# Patient Record
Sex: Female | Born: 1940 | Race: White | Hispanic: No | Marital: Married
Health system: Western US, Academic
[De-identification: ages and names within clinical notes are randomized; demographics above are authoritative.]

---

## 2019-12-08 ENCOUNTER — Ambulatory Visit: Payer: Medicare Other | Attending: Critical Care

## 2019-12-08 DIAGNOSIS — Z23 Encounter for immunization: Secondary | ICD-10-CM | POA: Insufficient documentation

## 2019-12-28 ENCOUNTER — Encounter: Payer: Self-pay | Admitting: Critical Care

## 2019-12-30 ENCOUNTER — Ambulatory Visit: Payer: Medicare Other | Attending: Critical Care

## 2019-12-30 DIAGNOSIS — Z23 Encounter for immunization: Secondary | ICD-10-CM

## 2020-08-26 ENCOUNTER — Ambulatory Visit: Payer: Medicare Other | Attending: Family Practice

## 2020-08-26 DIAGNOSIS — Z23 Encounter for immunization: Secondary | ICD-10-CM | POA: Insufficient documentation

## 2021-02-27 ENCOUNTER — Ambulatory Visit: Payer: Medicare Other | Attending: Nurse Practitioner

## 2021-02-27 DIAGNOSIS — Z23 Encounter for immunization: Secondary | ICD-10-CM | POA: Insufficient documentation

## 2021-02-27 NOTE — Interdisciplinary (Signed)
02/27/21 2:50 PT. HERE FOR PFIZER COVID BOOSTER VACCINE. GIVEN. Margaret Manning

## 2021-08-08 ENCOUNTER — Ambulatory Visit: Payer: Medicare Other | Attending: Nurse Practitioner

## 2021-08-08 DIAGNOSIS — Z23 Encounter for immunization: Secondary | ICD-10-CM | POA: Insufficient documentation

## 2021-08-08 NOTE — Interdisciplinary (Signed)
PT. HERE FOR COVID VACCINE PFIZER BIVALENT . GIVEN . L. Stoy Fenn

## 2021-10-21 IMAGING — MR MRA NECK WO/W CONTRAST
4 of 8 series · 26 of 48 positions shown · IV contrast (20cc ProHance)
Comparison: None.

INDICATION: 79-year-old female with vertigo and dizziness.
TECHNIQUE: MRA of the neck without and with intravenous contrast. Two-dimension time-of-flight MR angiography of the carotid bifurcations is performed without IV contrast.  This is followed by a 3D gadolinium bolus enhanced MR angiogram of the carotid arteries using 20 cc IV ProHance. 2D reconstruction including coronal and sagittal images and 3D reconstruction using MIP and volume rendering algorithms are performed under concurrent physician supervision.

[Series 3: tof_fl2d_axials · axial · 3.0mm · 0.39mm/px · z∈[-87,+100]mm · 7 of 94 slices shown]
[im 1/94]
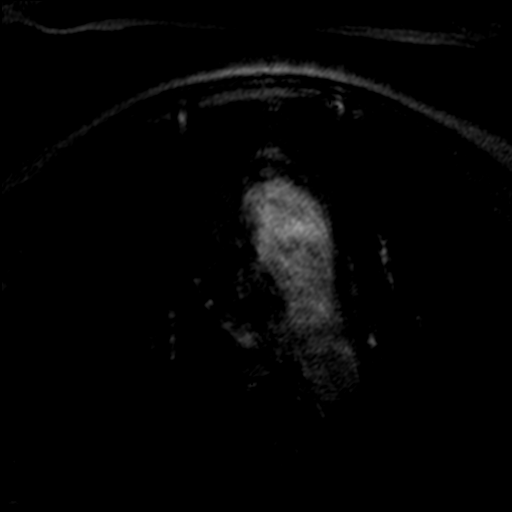
[im 16/94]
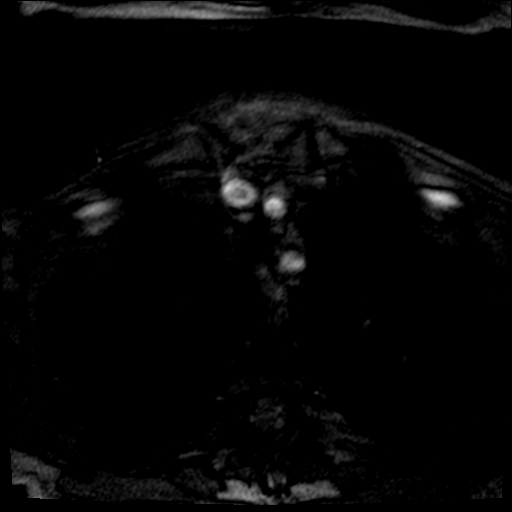
[im 32/94]
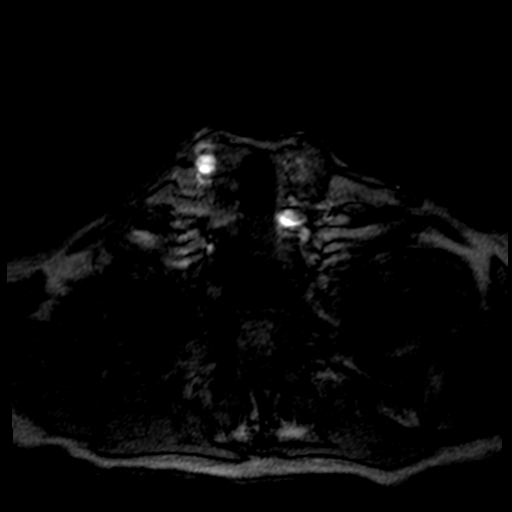
[im 47/94]
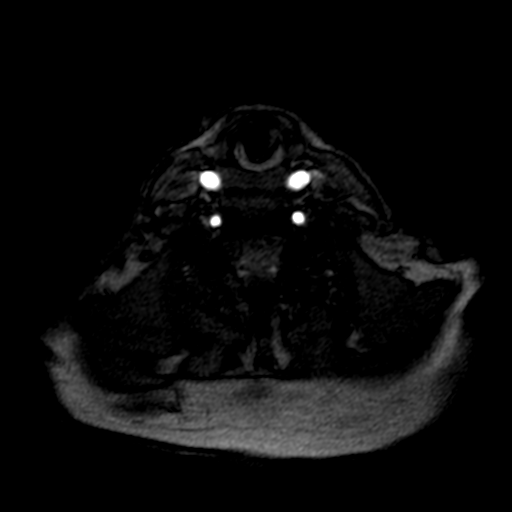
[im 63/94]
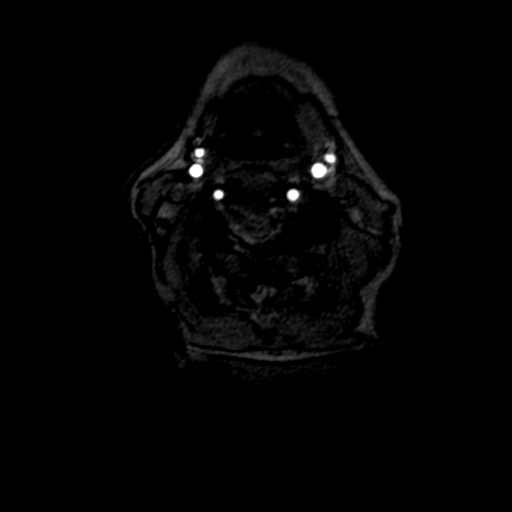
[im 78/94]
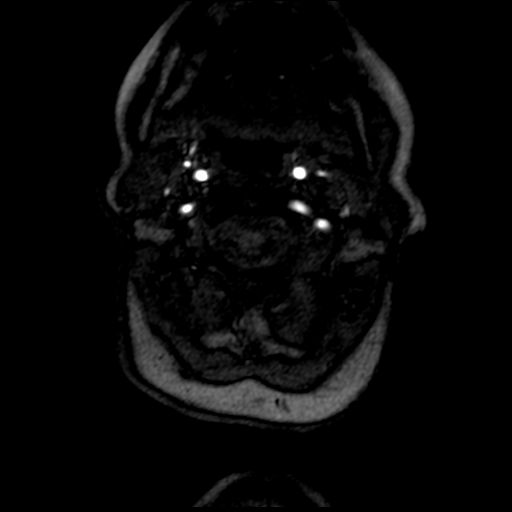
[im 94/94]
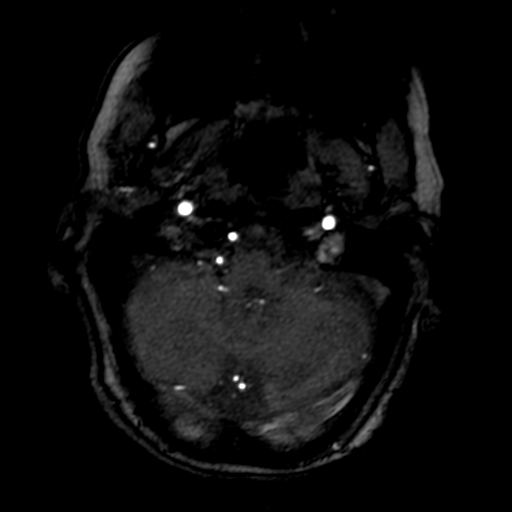

[Series 7: t1_axial_fs · axial · 5.0mm · 0.35mm/px · z∈[-93,+105]mm · 3 of 34 slices shown]
[im 1/34]
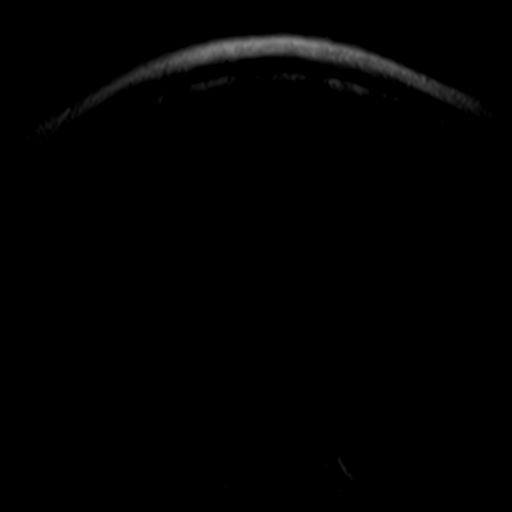
[im 17/34]
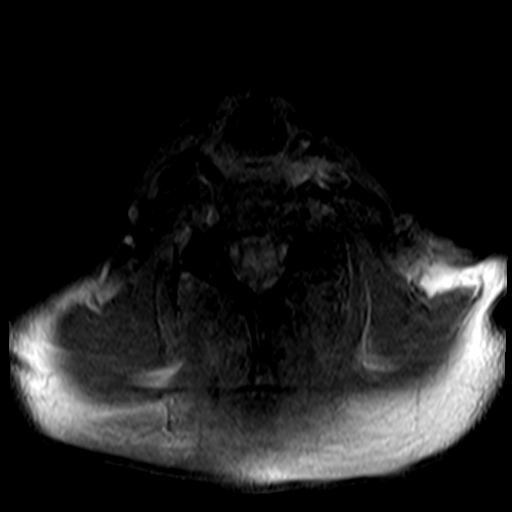
[im 34/34]
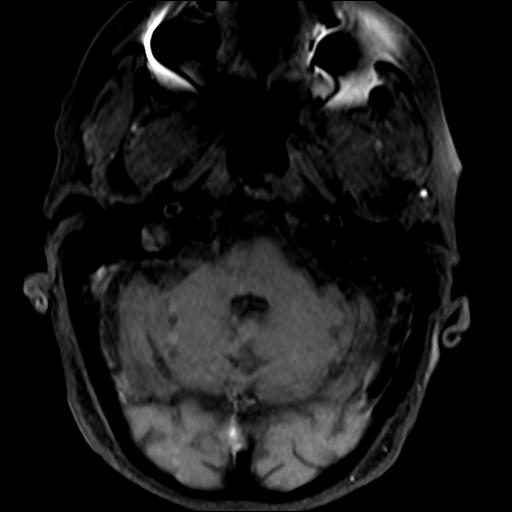

[Series 11: fl3d_ce_cor_+c_tt=1.0s · coronal · 1.0mm · 0.82mm/px · 8 of 96 slices shown]
[im 1/96]
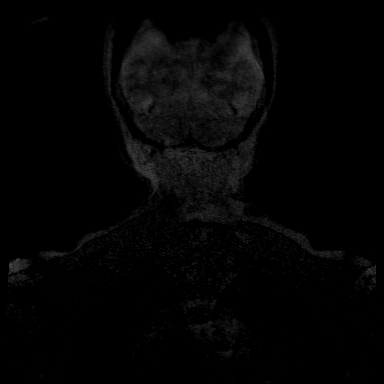
[im 14/96]
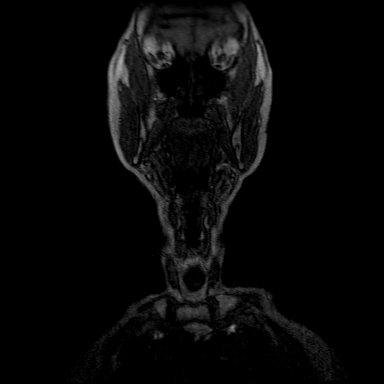
[im 28/96]
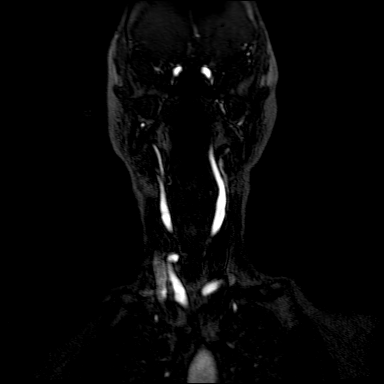
[im 41/96]
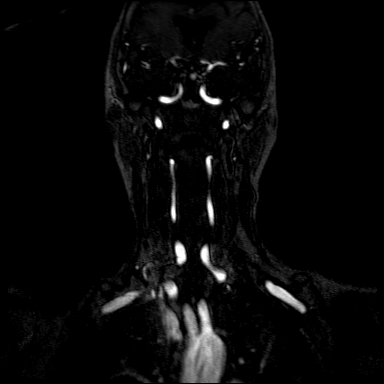
[im 55/96]
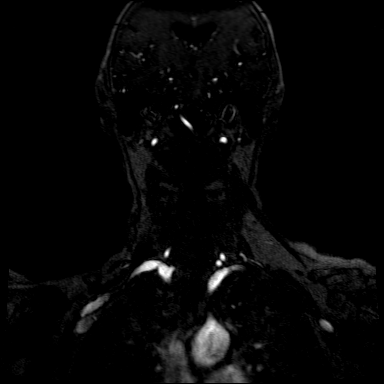
[im 68/96]
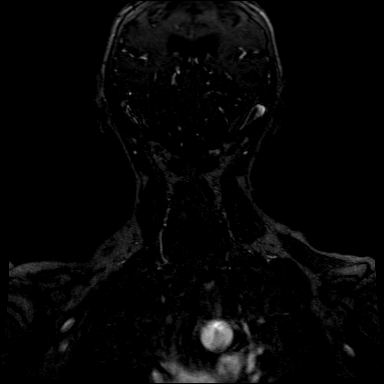
[im 82/96]
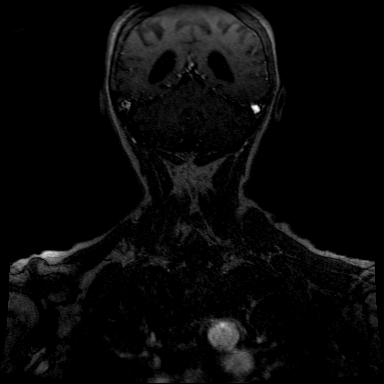
[im 96/96]
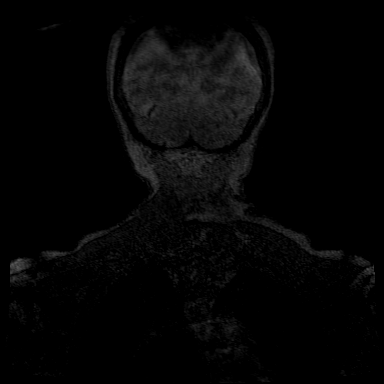

[Series 12: fl3d_ce_cor_+c_tt=1.0s_sub · coronal · 1.0mm · 0.82mm/px · 8 of 96 slices shown]
[im 1/96]
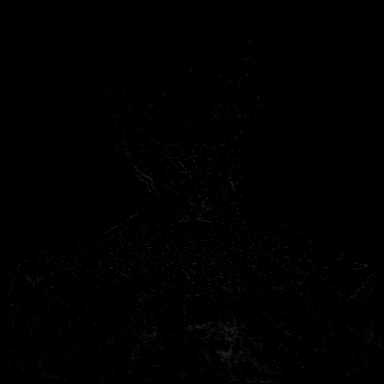
[im 14/96]
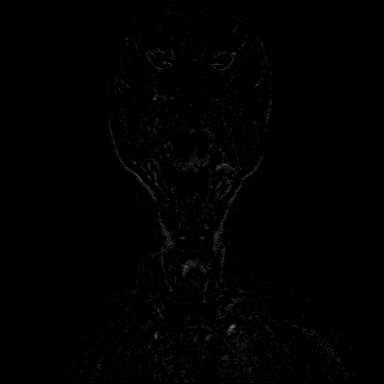
[im 28/96]
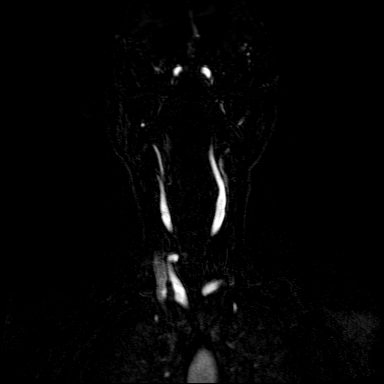
[im 41/96]
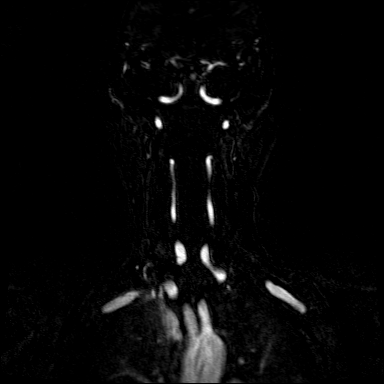
[im 55/96]
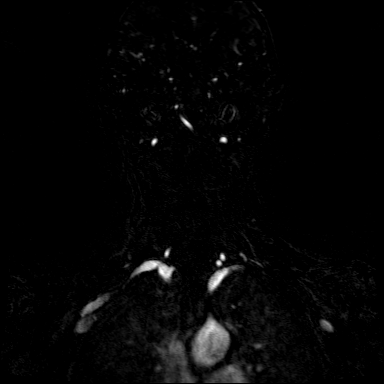
[im 68/96]
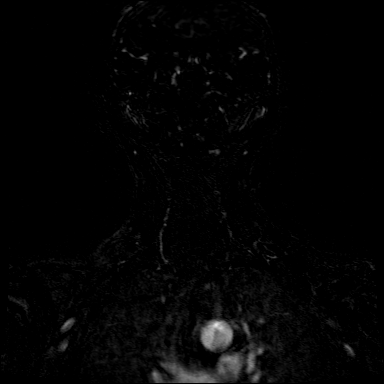
[im 82/96]
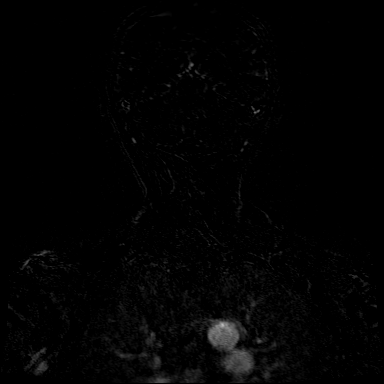
[im 96/96]
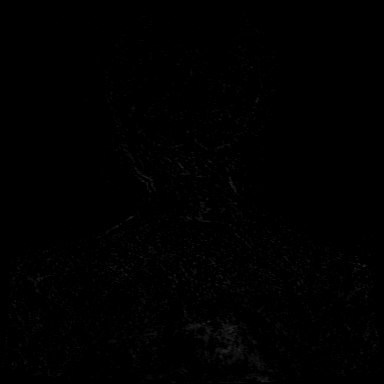

[26 of 48 positions shown; findings below may reference images not displayed]

FINDINGS: Aortic arch: The origins of the brachiocephalic, left common carotid  and the left subclavian arteries are patent.

Vertebral arteries: The right vertebral artery is patent.  The left vertebral artery is also patent.

Right carotid artery: The right common carotid, right internal and the right external carotid arteries are patent.

Left carotid artery: Moderate stenosis in the left external carotid artery, beginning approximately 2 cm distal to the carotid bifurcation (50%). The left common carotid and left internal carotid arteries are patent.

No mass or lymphadenopathy in the neck.

2-D and 3-D reconstructed images confirmed the above findings.
IMPRESSION: Moderate stenosis in the left external carotid artery (50%), about 2 cm distal to the carotid bifurcation.

No other significant stenosis in the cervical vessels of the neck.
